# Patient Record
Sex: Female | Born: 1950 | Race: White | Hispanic: No | Smoking: Former smoker
Health system: Southern US, Community
[De-identification: ages and names within clinical notes are randomized; demographics above are authoritative.]

## PROBLEM LIST (undated history)

## (undated) DIAGNOSIS — R9082 White matter disease, unspecified: Principal | ICD-10-CM

## (undated) DIAGNOSIS — F419 Anxiety disorder, unspecified: Secondary | ICD-10-CM

## (undated) DIAGNOSIS — E7800 Pure hypercholesterolemia, unspecified: Secondary | ICD-10-CM

## (undated) HISTORY — PX: TOTAL VAGINAL HYSTERECTOMY: SHX2548

## (undated) HISTORY — DX: White matter disease, unspecified: R90.82

## (undated) HISTORY — PX: TONSILLECTOMY: SUR1361

## (undated) HISTORY — DX: Pure hypercholesterolemia, unspecified: E78.00

## (undated) HISTORY — DX: Anxiety disorder, unspecified: F41.9

---

## 2002-05-17 ENCOUNTER — Encounter: Payer: Self-pay | Admitting: Internal Medicine

## 2002-05-17 ENCOUNTER — Encounter: Admission: RE | Admit: 2002-05-17 | Discharge: 2002-05-17 | Payer: Self-pay | Admitting: Internal Medicine

## 2003-05-31 ENCOUNTER — Encounter: Payer: Self-pay | Admitting: Internal Medicine

## 2003-05-31 ENCOUNTER — Encounter: Admission: RE | Admit: 2003-05-31 | Discharge: 2003-05-31 | Payer: Self-pay | Admitting: Internal Medicine

## 2004-06-02 ENCOUNTER — Encounter: Admission: RE | Admit: 2004-06-02 | Discharge: 2004-06-02 | Payer: Self-pay | Admitting: Internal Medicine

## 2005-06-03 ENCOUNTER — Encounter: Admission: RE | Admit: 2005-06-03 | Discharge: 2005-06-03 | Payer: Self-pay | Admitting: Internal Medicine

## 2005-08-24 ENCOUNTER — Encounter: Admission: RE | Admit: 2005-08-24 | Discharge: 2005-08-24 | Payer: Self-pay | Admitting: Internal Medicine

## 2005-08-25 ENCOUNTER — Encounter: Admission: RE | Admit: 2005-08-25 | Discharge: 2005-08-25 | Payer: Self-pay | Admitting: Internal Medicine

## 2005-09-06 ENCOUNTER — Encounter: Admission: RE | Admit: 2005-09-06 | Discharge: 2005-09-06 | Payer: Self-pay | Admitting: Internal Medicine

## 2006-01-24 ENCOUNTER — Encounter: Admission: RE | Admit: 2006-01-24 | Discharge: 2006-01-24 | Payer: Self-pay | Admitting: Gastroenterology

## 2006-06-01 IMAGING — US US ABDOMEN COMPLETE
1 series · 14 of 25 positions shown · non-contrast
Comparison: CT dated 09-06-05.

CLINICAL DATA: Left upper quadrant abdominal pain. 
 ABDOMINAL SONOGRAM COMPLETE:
TECHNIQUE: Complete abdominal ultrasound examination was performed including evaluation of the liver, gallbladder, bile ducts, pancreas, kidneys, spleen, IVC, and abdominal aorta.

[Series 1: unknown · 14 of 60 slices shown]
[im 1/60]
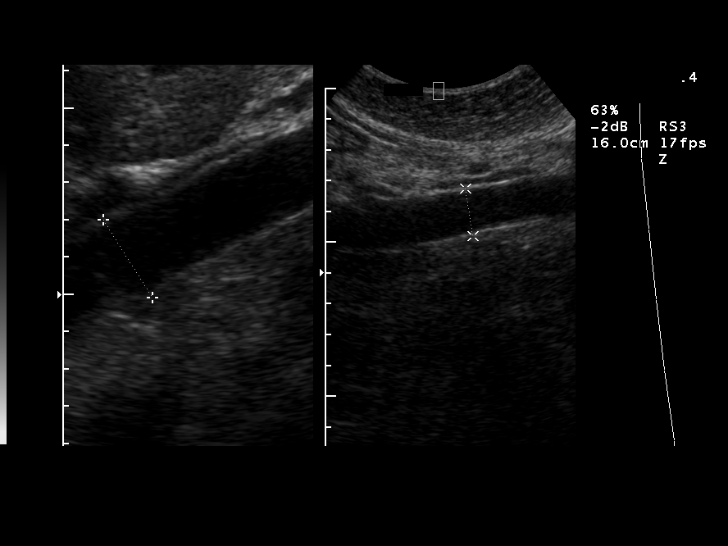
[im 5/60]
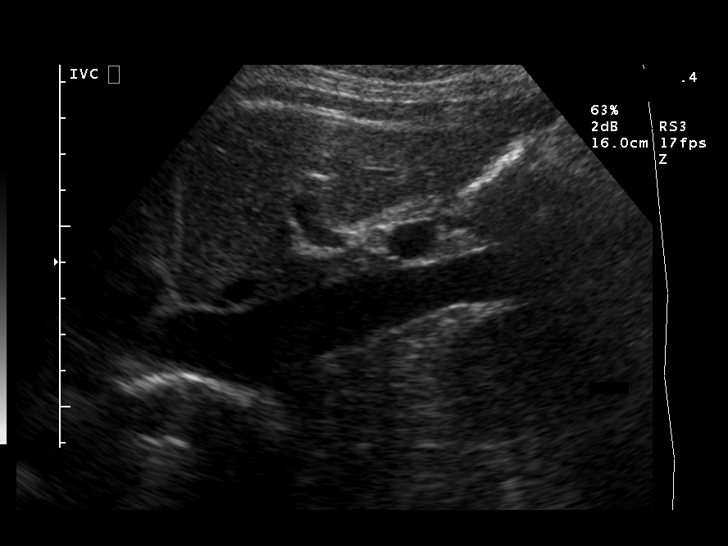
[im 10/60]
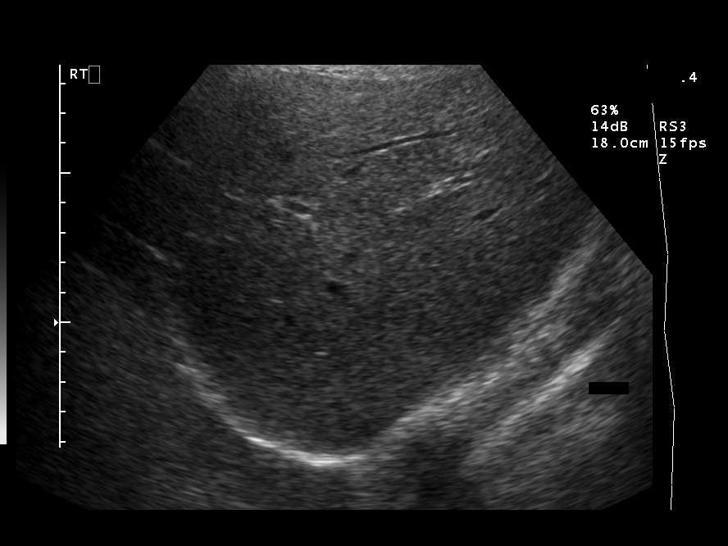
[im 15/60]
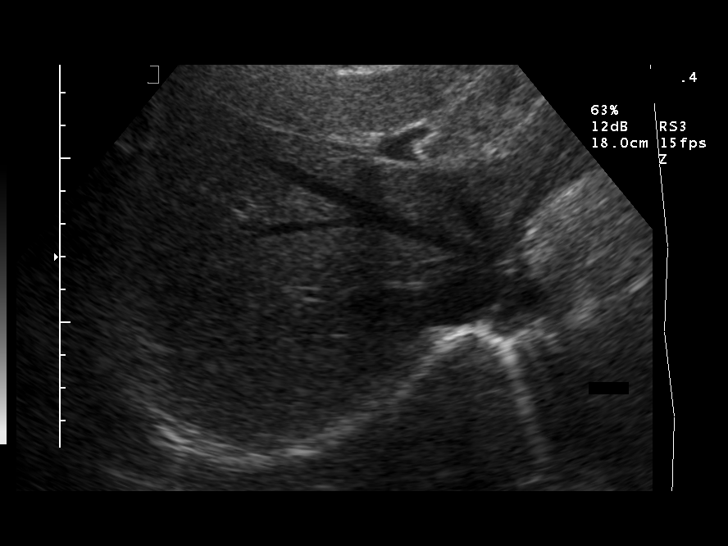
[im 20/60]
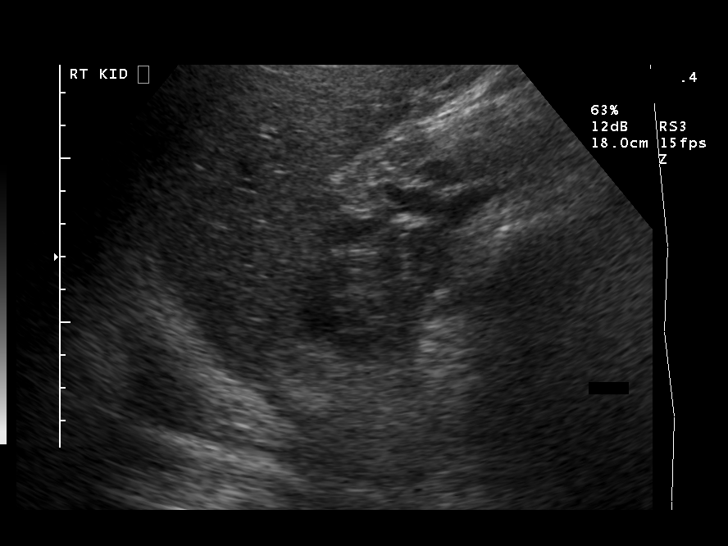
[im 23/60]
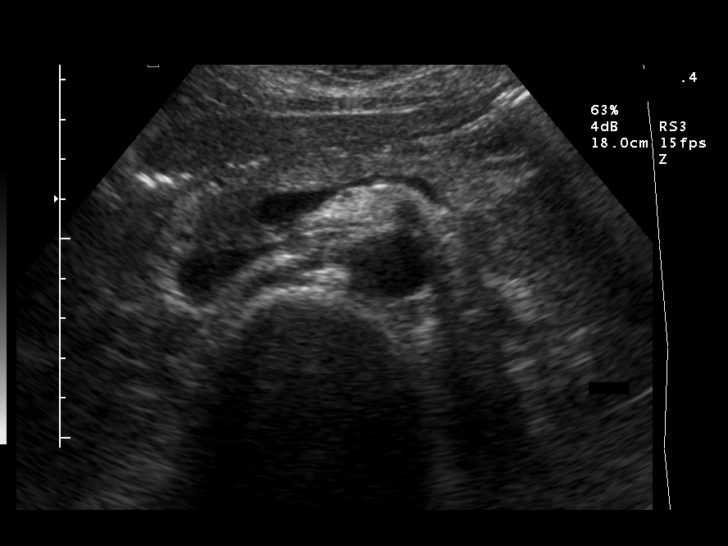
[im 28/60]
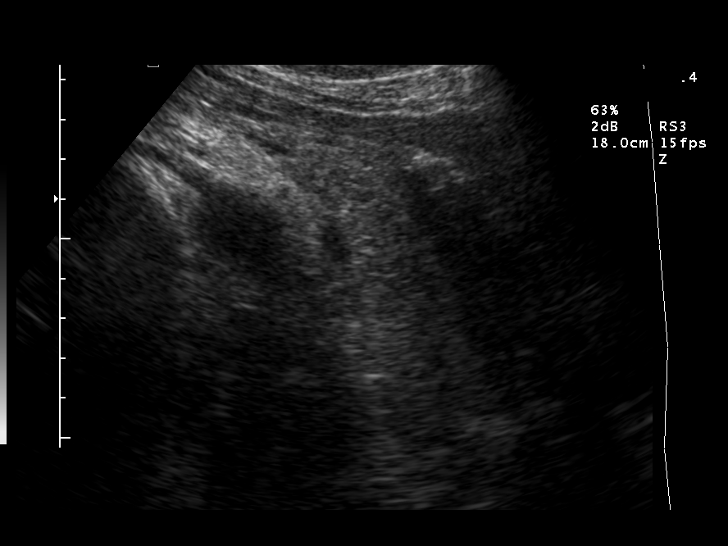
[im 32/60]
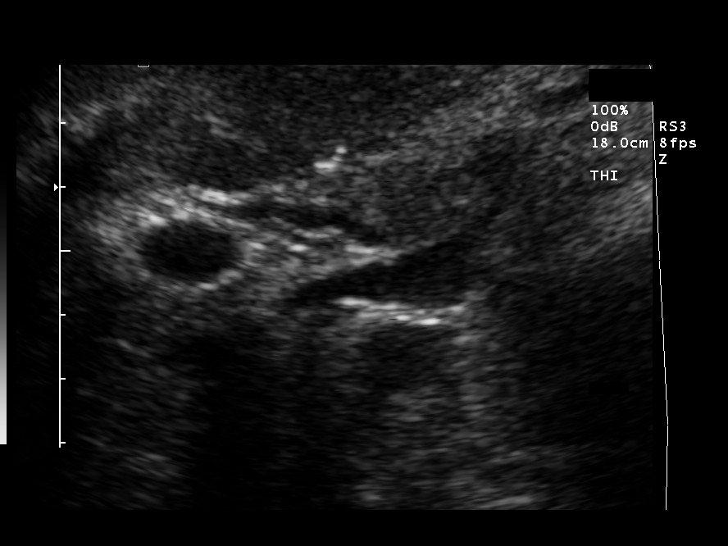
[im 37/60]
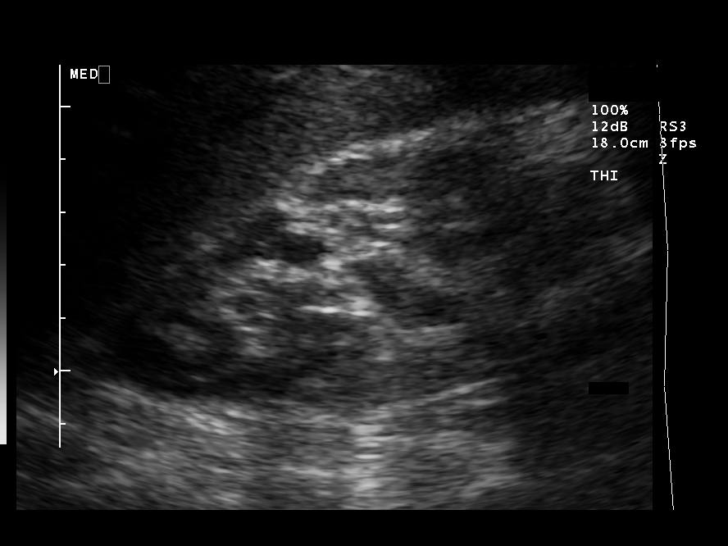
[im 40/60]
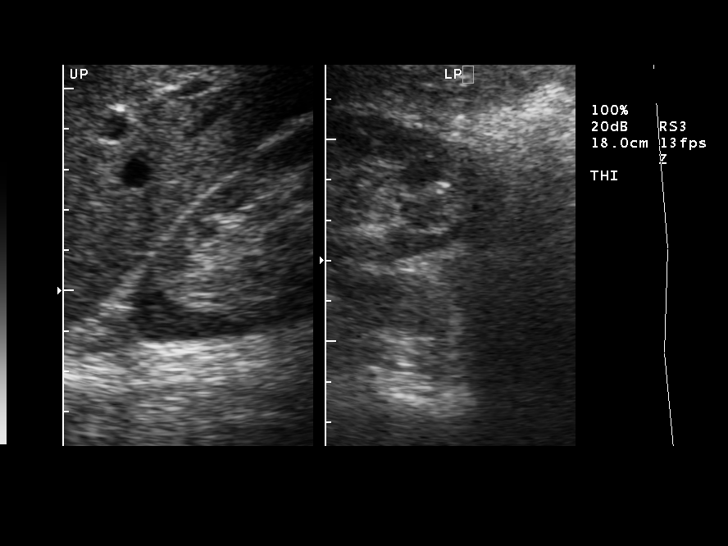
[im 45/60]
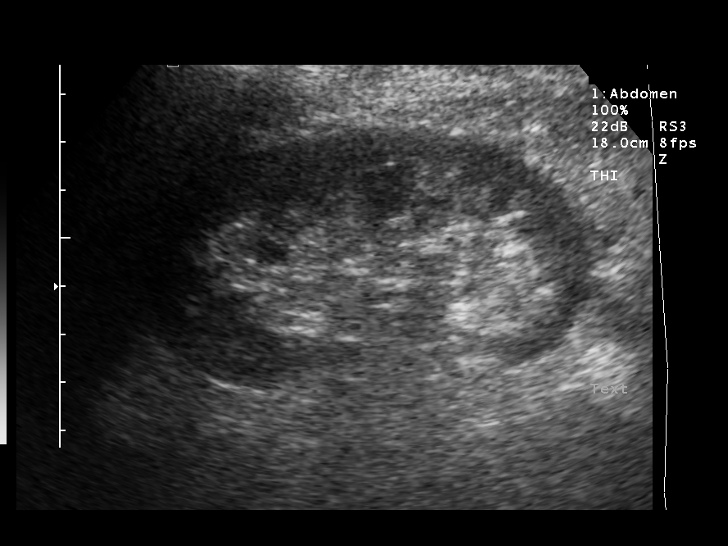
[im 50/60]
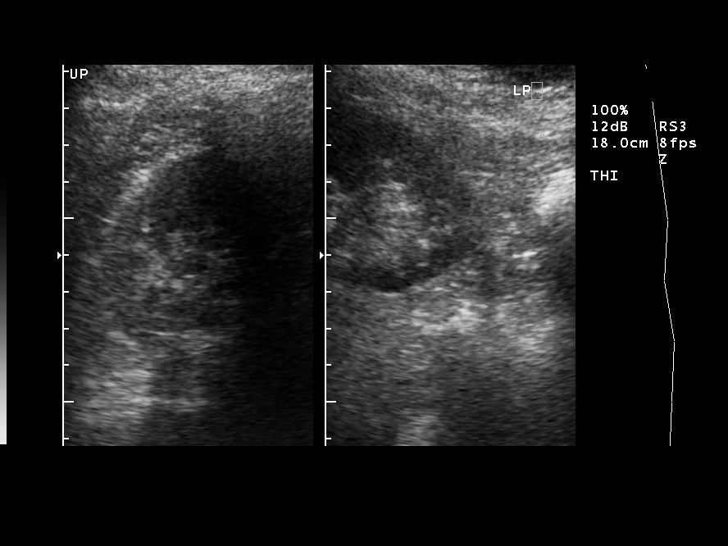
[im 55/60]
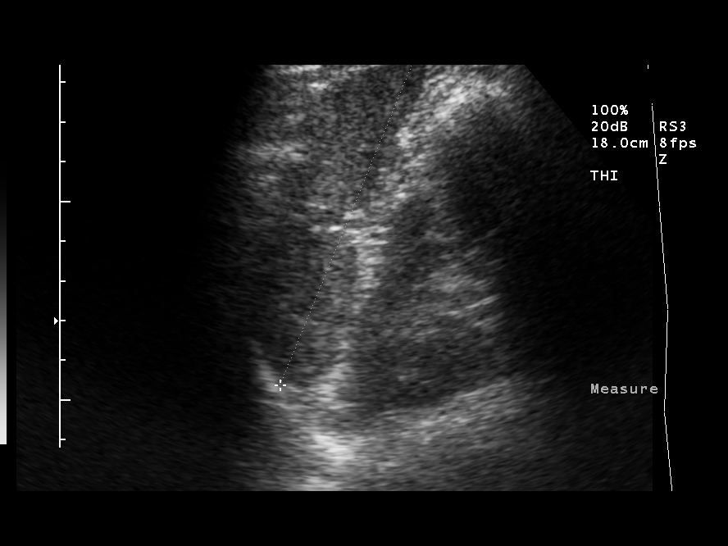
[im 60/60]
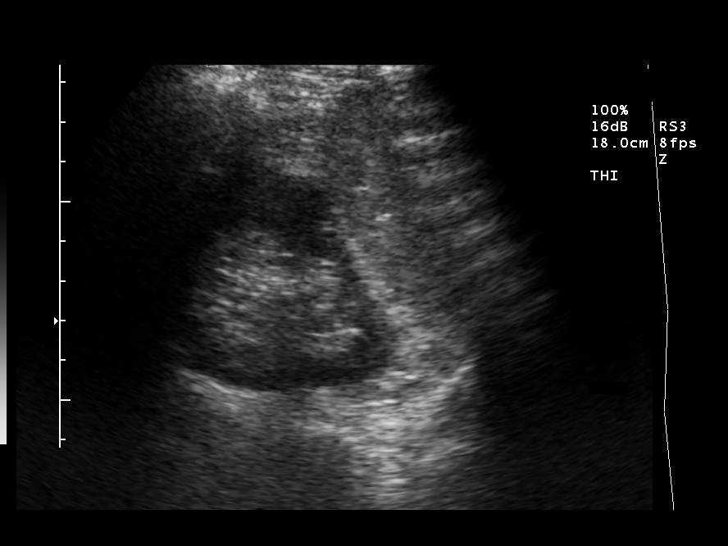

[14 of 25 positions shown; findings below may reference images not displayed]

FINDINGS: The patient is status post cholecystectomy.  Common bile duct is normal measuring 3.7 mm.  Liver negative.
 IVC normal. 
 Pancreas negative.  
 Spleen negative.  
 Right kidney is normal.
 Left kidney is normal.
 Abdominal aorta is normal in AP diameter measuring 2.5 cm.
IMPRESSION: 1.  Status post cholecystectomy.
 2.  No acute findings.

## 2006-06-21 ENCOUNTER — Encounter: Admission: RE | Admit: 2006-06-21 | Discharge: 2006-06-21 | Payer: Self-pay | Admitting: Internal Medicine

## 2007-06-29 ENCOUNTER — Encounter: Admission: RE | Admit: 2007-06-29 | Discharge: 2007-06-29 | Payer: Self-pay | Admitting: Endocrinology

## 2008-07-12 ENCOUNTER — Encounter: Admission: RE | Admit: 2008-07-12 | Discharge: 2008-07-12 | Payer: Self-pay | Admitting: Family Medicine

## 2009-07-14 ENCOUNTER — Encounter: Admission: RE | Admit: 2009-07-14 | Discharge: 2009-07-14 | Payer: Self-pay | Admitting: Family Medicine

## 2010-07-15 ENCOUNTER — Encounter: Admission: RE | Admit: 2010-07-15 | Discharge: 2010-07-15 | Payer: Self-pay | Admitting: *Deleted

## 2010-07-20 ENCOUNTER — Encounter: Admission: RE | Admit: 2010-07-20 | Discharge: 2010-07-20 | Payer: Self-pay | Admitting: *Deleted

## 2011-07-20 ENCOUNTER — Other Ambulatory Visit: Payer: Self-pay | Admitting: Family Medicine

## 2011-07-20 DIAGNOSIS — Z1231 Encounter for screening mammogram for malignant neoplasm of breast: Secondary | ICD-10-CM

## 2011-07-27 ENCOUNTER — Ambulatory Visit: Payer: Self-pay

## 2011-07-28 ENCOUNTER — Ambulatory Visit
Admission: RE | Admit: 2011-07-28 | Discharge: 2011-07-28 | Disposition: A | Discharge status: Home / Self Care | Payer: BC Managed Care – PPO | Source: Ambulatory Visit | Attending: Family Medicine | Admitting: Family Medicine

## 2011-07-28 DIAGNOSIS — Z1231 Encounter for screening mammogram for malignant neoplasm of breast: Secondary | ICD-10-CM

## 2013-02-14 ENCOUNTER — Other Ambulatory Visit: Payer: Self-pay | Admitting: Family Medicine

## 2013-02-14 DIAGNOSIS — M8580 Other specified disorders of bone density and structure, unspecified site: Secondary | ICD-10-CM

## 2013-02-14 DIAGNOSIS — Z1231 Encounter for screening mammogram for malignant neoplasm of breast: Secondary | ICD-10-CM

## 2013-04-03 ENCOUNTER — Ambulatory Visit
Admission: RE | Admit: 2013-04-03 | Discharge: 2013-04-03 | Disposition: A | Discharge status: Home / Self Care | Payer: BC Managed Care – PPO | Source: Ambulatory Visit | Attending: Family Medicine | Admitting: Family Medicine

## 2013-04-03 DIAGNOSIS — Z1231 Encounter for screening mammogram for malignant neoplasm of breast: Secondary | ICD-10-CM

## 2013-04-03 DIAGNOSIS — M8580 Other specified disorders of bone density and structure, unspecified site: Secondary | ICD-10-CM

## 2013-07-13 ENCOUNTER — Ambulatory Visit (INDEPENDENT_AMBULATORY_CARE_PROVIDER_SITE_OTHER): Payer: BC Managed Care – PPO | Admitting: Internal Medicine

## 2013-07-13 DIAGNOSIS — Z23 Encounter for immunization: Secondary | ICD-10-CM

## 2013-07-13 DIAGNOSIS — Z789 Other specified health status: Secondary | ICD-10-CM | POA: Insufficient documentation

## 2013-07-13 MED ORDER — ACETAZOLAMIDE 125 MG PO TABS: 125.0000 mg | ORAL_TABLET | Freq: Three times a day (TID) | ORAL | Status: DC

## 2013-07-13 MED ORDER — ATOVAQUONE-PROGUANIL HCL 250-100 MG PO TABS: 1.0000 | ORAL_TABLET | Freq: Every day | ORAL | Status: DC

## 2013-07-13 MED ORDER — CIPROFLOXACIN HCL 500 MG PO TABS: 500.0000 mg | ORAL_TABLET | Freq: Two times a day (BID) | ORAL | Status: DC

## 2013-07-13 MED ORDER — TYPHOID VACCINE PO CPDR: 1.0000 | DELAYED_RELEASE_CAPSULE | ORAL | Status: DC

## 2013-07-13 NOTE — Progress Notes (Signed)
  Subjective:    Caitlyn Yates is a 62 y.o. female who presents to the Infectious Disease clinic for travel consultation. Planned departure date: September 8          Planned return date: 8 days Countries of travel: [place] Areas in country: [place] to [place]   Accommodations: hotel Purpose of travel: vacation Prior travel out of [place]: yes Currently ill / Fever: no History of liver or kidney disease: no  Data Review:  medical history reviewed   Review of Systems n/a    Objective:    n/a    Assessment:    No contraindications to travel. none      Plan:    Issues discussed: altitude illness, environmental concerns, future shots, insect-borne illnesses, malaria, motion sickness, MVA safety, rabies, safe food/water, traveler's diarrhea, website/handouts for more information, what to do if ill upon return, what to do if ill while there and Yellow Fever. Immunizations recommended: Typhoid (oral) and Yellow Fever.discussed risks and benefits of vaccine Malaria prophylaxis: malarone, daily dose starting 1-2 days before entering endemic area, ending 7 days after leaving area Traveler's diarrhea prophylaxis: ciprofloxacin.

## 2013-07-20 ENCOUNTER — Other Ambulatory Visit: Payer: Self-pay | Admitting: *Deleted

## 2013-07-20 DIAGNOSIS — Z23 Encounter for immunization: Secondary | ICD-10-CM

## 2013-07-20 MED ORDER — TYPHOID VACCINE PO CPDR: 1.0000 | DELAYED_RELEASE_CAPSULE | ORAL | Status: DC

## 2013-08-20 ENCOUNTER — Telehealth: Payer: Self-pay | Admitting: *Deleted

## 2013-08-20 NOTE — Telephone Encounter (Signed)
Doubt malarone.  Could be something infectious.  If she doesn't get in to her primary, she should see one of [place] to at least do an evaluation and work up.

## 2013-08-20 NOTE — Telephone Encounter (Signed)
Patient called c/o not feeling well since returning from foreign travel. Dark stools and fatigue; wondered if it could have been caused from the Malarone she was prescribed. Told her I would send a note to Dr. Luciana Axe and advised her to call her PCP and arrange an appt. Wendall Mola

## 2013-08-21 NOTE — Telephone Encounter (Signed)
Patient did see PCP, lab work done. She said she is feeling somewhat better; but if she continues to have issues she will call the clinic for travel follow up appt. Wendall Mola

## 2013-10-09 ENCOUNTER — Encounter (INDEPENDENT_AMBULATORY_CARE_PROVIDER_SITE_OTHER): Payer: Self-pay

## 2013-10-09 ENCOUNTER — Encounter: Payer: Self-pay | Admitting: Diagnostic Neuroimaging

## 2013-10-09 ENCOUNTER — Ambulatory Visit (INDEPENDENT_AMBULATORY_CARE_PROVIDER_SITE_OTHER): Payer: BC Managed Care – PPO | Admitting: Diagnostic Neuroimaging

## 2013-10-09 VITALS — BP 101/69 | HR 56 | Temp 97.8°F | Ht 63.0 in | Wt 120.0 lb

## 2013-10-09 DIAGNOSIS — R413 Other amnesia: Secondary | ICD-10-CM

## 2013-10-09 DIAGNOSIS — G3184 Mild cognitive impairment, so stated: Secondary | ICD-10-CM

## 2013-10-09 NOTE — Patient Instructions (Signed)
Exercise, fruits and vegetables are very important to include in your daily routine.  I will setup MRI and neuropsych testing.

## 2013-10-09 NOTE — Progress Notes (Addendum)
[facility]  PATIENT: Caitlyn Yates DOB: Jan 28, 1951  REFERRING CLINICIAN: Fulp HISTORY FROM: patient REASON FOR VISIT: new consult   HISTORICAL  CHIEF COMPLAINT:  Chief Complaint  Patient presents with  . Memory Loss    HISTORY OF PRESENT ILLNESS:   62 year old right-handed female with hyperkalemia, anxiety, here for evaluation of memory loss.  Patient tells me that she thinks she has had a lifelong history of attention deficit disorder. She recalls times throughout her life when someone would talk to her and rather than listen to the other person, patient would think of one topic, jumped to the next topic, jump to the next topic and so on and then other person would question whether patient was listening to them or not. This is happened throughout her life as well as more recently. Her daughter has noted this particularly.  Around 2013, patient has noted increasing short-term memory problems. She also has some difficulty with driving directions and getting around. Other symptoms include feeling like a "foggy brain" sensation, difficulty with navigation, difficulty with recall of information, and telling the same story over and over again. No significant depression. She has had some mild anxiety tendencies. She has had some mild insomnia problems.   REVIEW OF SYSTEMS: Full 14 system review of systems performed and notable only for insomnia sleepiness memory loss confusion feeling cold fatigue.  ALLERGIES: No Known Allergies  HOME MEDICATIONS: Outpatient Prescriptions Prior to Visit  Medication Sig Dispense Refill  . acetaZOLAMIDE (DIAMOX) 125 MG tablet Take 1 tablet (125 mg total) by mouth 3 (three) times daily.  16 tablet  0  . atovaquone-proguanil (MALARONE) 250-100 MG TABS Take 1 tablet by mouth daily.  16 tablet  0  . ciprofloxacin (CIPRO) 500 MG tablet Take 1 tablet (500 mg total) by mouth 2 (two) times daily.  10 tablet  0  . typhoid (VIVOTIF) DR  capsule Take 1 capsule by mouth every other day.  4 capsule  0   No facility-administered medications prior to visit.    PAST MEDICAL HISTORY: Past Medical History  Diagnosis Date  . High cholesterol   . Anxiety     PAST SURGICAL HISTORY: Past Surgical History  Procedure Laterality Date  . Tonsillectomy    . Total vaginal hysterectomy      FAMILY HISTORY: Family History  Problem Relation Age of Onset  . Ovarian cancer Mother   . Cancer Father   . Memory loss Father     SOCIAL HISTORY:  History   Social History  . Marital Status: Widowed    Spouse Name: N/A    Number of Children: 2  . Years of Education: MA   Occupational History  . Retired    Social History Main Topics  . Smoking status: Former Smoker -- 2.00 packs/day for 20 years    Types: Cigarettes    Quit date: 11/29/1986  . Smokeless tobacco: Never Used  . Alcohol Use: No  . Drug Use: No  . Sexual Activity: Not on file   Other Topics Concern  . Not on file   Social History Narrative   Patient live at home alone.   Caffeine Use: 16oz-20oz daily     PHYSICAL EXAM  Filed Vitals:   10/09/13 0857  BP: 101/69  Pulse: 56  Temp: 97.8 F (36.6 C)  TempSrc: Oral  Height: 5\' 3"  (1.6 m)  Weight: 120 lb (54.432 kg)    Not recorded    Body mass index is 21.26  kg/(m^2).  GENERAL EXAM: Patient is in no distress  CARDIOVASCULAR: Regular rate and rhythm, no murmurs, no carotid bruits  NEUROLOGIC: MENTAL STATUS: awake, alert, language fluent, comprehension intact, naming intact; MMSE 27/30 (misses date, day, last 1 on serial 7). MOCA 21/30 (0/5 recall). Letter fluency test = 21. GDS 2. BORDERLINE MYERSON'S. NEG SNOUT, PALMOMENTAL AND ROOTING REFLEXES. CRANIAL NERVE: no papilledema on fundoscopic exam, pupils equal and reactive to light, visual fields full to confrontation, extraocular muscles intact, no nystagmus, facial sensation and strength symmetric, uvula midline, shoulder shrug symmetric,  tongue midline. MOTOR: normal bulk and tone, full strength in the BUE, BLE SENSORY: normal and symmetric to light touch, temperature, vibration COORDINATION: finger-nose-finger, fine finger movements normal; MINIMAL POSTURAL AND ACTION TREMOR. REFLEXES: deep tendon reflexes present; DECR IN LEFT LEG (1+). GAIT/STATION: narrow based gait; able to walk tandem; romberg is negative   DIAGNOSTIC DATA (LABS, IMAGING, TESTING) - I reviewed patient records, labs, notes, testing and imaging myself where available.  No results found for this basename: WBC, HGB, HCT, MCV, PLT   No results found for this basename: na, k, cl, co2, glucose, bun, creatinine, calcium, prot, albumin, ast, alt, alkphos, bilitot, gfrnonaa, gfraa   No results found for this basename: CHOL, HDL, LDLCALC, LDLDIRECT, TRIG, CHOLHDL   No results found for this basename: HGBA1C   No results found for this basename: VITAMINB12   No results found for this basename: TSH    B12 1060  Vit D 73  T4 7.9   ASSESSMENT AND PLAN  62 y.o. year old female here with 1 year history of memory issues, poor attention, brain fog.  MMSE 27/30 (misses date, day, last 1 on serial 7). MOCA 21/30 (0/5 recall). Letter fluency test = 21. GDS 2. BORDERLINE MYERSON'S. NEG SNOUT, PALMOMENTAL AND ROOTING REFLEXES.  Ddx: MCI, mild anxiety state, ADD, autoimmune/inflamm, vascular  PLAN: - add'l testing - exercise, healthy diet and sleep reviewed  Orders Placed This Encounter  Procedures  . MR Brain Wo Contrast  . Ambulatory referral to Neuropsychology    Return in about 3 months (around 01/09/2014) for with Heide Guile or Antonis Lor.    Suanne Marker, MD 10/09/2013, 9:51 AM Certified in Neurology, Neurophysiology and Neuroimaging  [facility] [address], [address] [place], [place] [address] [phone]

## 2013-10-12 ENCOUNTER — Telehealth: Payer: Self-pay | Admitting: *Deleted

## 2013-10-12 NOTE — Telephone Encounter (Signed)
I called pt back and relayed that after speaking to both providers in question, change was made to Dr. Anne Hahn.  Pt will have MRI after authorized and then [facility] or facility will call to schedule.  This can take up to 10 days.  Pt then will be called with results (either Dr. Anne Hahn or staff).  F/u appt confirmed with pt for 01-11-13 with LLam, NP and Dr. Anne Hahn as provider (who is on site when seen by NP).  She verbalized understanding.

## 2013-10-30 ENCOUNTER — Ambulatory Visit: Payer: BC Managed Care – PPO | Admitting: Licensed Clinical Social Worker

## 2013-10-31 ENCOUNTER — Ambulatory Visit (INDEPENDENT_AMBULATORY_CARE_PROVIDER_SITE_OTHER): Payer: BC Managed Care – PPO

## 2013-10-31 ENCOUNTER — Encounter: Payer: Self-pay | Admitting: Nurse Practitioner

## 2013-10-31 DIAGNOSIS — G3184 Mild cognitive impairment, so stated: Secondary | ICD-10-CM

## 2013-10-31 DIAGNOSIS — R413 Other amnesia: Secondary | ICD-10-CM

## 2013-11-02 ENCOUNTER — Telehealth: Payer: Self-pay | Admitting: Neurology

## 2013-11-08 ENCOUNTER — Telehealth: Payer: Self-pay | Admitting: Nurse Practitioner

## 2013-11-09 ENCOUNTER — Telehealth: Payer: Self-pay | Admitting: Neurology

## 2013-11-09 DIAGNOSIS — R9089 Other abnormal findings on diagnostic imaging of central nervous system: Secondary | ICD-10-CM

## 2013-11-09 DIAGNOSIS — R413 Other amnesia: Secondary | ICD-10-CM

## 2013-11-09 NOTE — Telephone Encounter (Signed)
Please advise 

## 2013-11-09 NOTE — Telephone Encounter (Signed)
Caitlyn Yates, patient has requested transition to you a couple weeks ago. -VRP

## 2013-11-09 NOTE — Telephone Encounter (Signed)
Caitlyn Yates from [facility], Dr Fulp's office called stating that patient is very emotional and upset on the phone and would like an earlier follow up appointment than the one scheduled in February. Dr Jillyn Hidden is wondering if we can see her sooner. Please call to schedule.

## 2013-11-09 NOTE — Telephone Encounter (Signed)
I called patient. The patient had the MRI the brain done on 11/01/2013. This shows multiple ovoid white matter lesions, some perpendicular to the corpus callosum, sounds as if it may be a pattern consistent with demyelinating disease. The patient is followed by Dr. Danae Orleans, who is not in the office today. It is likely that he will want to get further blood work, and probable lumbar puncture. I will go ahead and order MRI of the cervical spine, as if lesions are seen in the spinal cord, this could also help with the diagnosis. The patient does not have significant risk factors for cerebrovascular disease. Dr. Danae Orleans will need to call the patient when he gets back.

## 2013-11-23 ENCOUNTER — Telehealth: Payer: Self-pay | Admitting: *Deleted

## 2013-11-26 ENCOUNTER — Ambulatory Visit
Admission: RE | Admit: 2013-11-26 | Discharge: 2013-11-26 | Disposition: A | Discharge status: Home / Self Care | Payer: BC Managed Care – PPO | Source: Ambulatory Visit | Attending: Neurology | Admitting: Neurology

## 2013-11-26 DIAGNOSIS — R930 Abnormal findings on diagnostic imaging of skull and head, not elsewhere classified: Secondary | ICD-10-CM

## 2013-11-26 DIAGNOSIS — R413 Other amnesia: Secondary | ICD-10-CM

## 2013-11-26 DIAGNOSIS — R9089 Other abnormal findings on diagnostic imaging of central nervous system: Secondary | ICD-10-CM

## 2013-11-26 MED ORDER — GADOBENATE DIMEGLUMINE 529 MG/ML IV SOLN
11.0000 mL | Freq: Once | INTRAVENOUS | Status: AC | PRN
  Administered 2013-11-26: 11 mL via INTRAVENOUS

## 2013-11-26 NOTE — Telephone Encounter (Signed)
Called patient in regards to the message on 11/23/13, left message to return call

## 2013-11-26 NOTE — Telephone Encounter (Signed)
Patient returned call, questioned why 2nd MRI was ordered and explained to patient, patient was informed that the office will be closed 31st-2nd, if she had any other questions or concerns

## 2013-12-02 ENCOUNTER — Telehealth: Payer: Self-pay | Admitting: Neurology

## 2013-12-02 DIAGNOSIS — R9089 Other abnormal findings on diagnostic imaging of central nervous system: Secondary | ICD-10-CM

## 2013-12-02 NOTE — Telephone Encounter (Signed)
I called the patient. The MRI of the cervical spine shows a disc bulge at the C5-6 level with moderate facet joint arthritis at this level. No cord or NR impingement. The MRI of the brain is abnormal, could be MS. I will get an LP and further blood work done. She claims that Dr. Alessandra BevelsVaughn has done a connective tissue disease work up.

## 2013-12-13 ENCOUNTER — Ambulatory Visit (INDEPENDENT_AMBULATORY_CARE_PROVIDER_SITE_OTHER): Payer: BC Managed Care – PPO | Admitting: Neurology

## 2013-12-13 ENCOUNTER — Encounter: Payer: Self-pay | Admitting: Neurology

## 2013-12-13 VITALS — BP 110/62 | HR 58 | Wt 127.0 lb

## 2013-12-13 DIAGNOSIS — R5381 Other malaise: Secondary | ICD-10-CM

## 2013-12-13 DIAGNOSIS — R9089 Other abnormal findings on diagnostic imaging of central nervous system: Secondary | ICD-10-CM

## 2013-12-13 DIAGNOSIS — R9082 White matter disease, unspecified: Secondary | ICD-10-CM

## 2013-12-13 DIAGNOSIS — R930 Abnormal findings on diagnostic imaging of skull and head, not elsewhere classified: Secondary | ICD-10-CM

## 2013-12-13 DIAGNOSIS — R5383 Other fatigue: Secondary | ICD-10-CM

## 2013-12-13 HISTORY — DX: White matter disease, unspecified: R90.82

## 2013-12-13 NOTE — Procedures (Signed)
    Lumbar puncture procedure note  History:   Caitlyn Yates is a 63 year old patient with a several year history of problems with concentration and memory, worse over the last 2 months. The patient also reports a significant increase in fatigue over the last 2 months. 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 The patient is being evaluated for this issue.  The patient was placed in the fetal position on the right side, and the low back was cleaned with Betadine solution. Approximately 2 cc of 1% Xylocaine was used as a local anesthetic. A 20-gauge spinal needle was inserted into the L3-4 interspace, and approximately 18 cc of clear colorless spinal fluid was removed for testing. Opening pressure was 130 mm of water.  Tube #1 was sent for VDRL, cryptococcal antigen, angiotensin-converting enzyme level.  Tube #2 was sent for oligoclonal banding, IgG albumin ratio.  Tube #3 was sent for cells, differential, glucose, and protein.  Tube #4 was sent for Lyme antibody panel.  The patient tolerated the procedure well. No complications of the above procedure were noted.  Blood work for ANA, rheumatoid factor, lupus anticoagulant antibody, factor V Leiden, B12 level was sent.

## 2013-12-13 NOTE — Progress Notes (Signed)
Caitlyn Yates is a 63 year old patient with a several year history of difficulty with concentration and memory. This may have worsened over the last several months prior to this evaluation. The patient also reports a significant worsening of fatigue that has occurred over the last 2 months. MRI of the brain has shown evidence of white matter changes that are consistent with demyelinating disease. MRI of the cervical spine was unremarkable.  The patient comes in today for lumbar puncture.  Physical Exam  General: The patient is alert and cooperative at the time of the examination.  Skin: No significant peripheral edema is noted.   Neurologic Exam  Mental status: The patient is oriented x 3.  Cranial nerves: Facial symmetry is present. Speech is normal, no aphasia or dysarthria is noted. Extraocular movements are full. Visual fields are full.  Motor: The patient has good strength in all 4 extremities.  Sensory examination: Soft touch sensation on the face, arms, and legs is symmetric.  Coordination: The patient has good finger-nose-finger and heel-to-shin bilaterally.  Gait and station: The patient has a normal gait. Tandem gait is normal. Romberg is negative. No drift is seen.  Reflexes: Deep tendon reflexes are symmetric.   Lumbar puncture was done today, blood work will be done today. The patient will followup in 3 months.

## 2013-12-14 LAB — IGG CSF INDEX
ALBUMIN: 4.3 g/dL (ref 3.6–4.8)
Albumin CSF-mCnc: 19 mg/dL (ref 11–48)
IGG INDEX CSF: 0.6 (ref 0.0–0.7)
IgG (Immunoglobin G), Serum: 937 mg/dL (ref 700–1600)
IgG, CSF: 2.3 mg/dL (ref 0.0–8.6)
IgG/Albumin Ratio, CSF: 0.12 (ref 0.00–0.25)

## 2013-12-14 LAB — LYME, WESTERN BLOT, CSF
LYME IGG WB INTERP.: NEGATIVE
Lyme IgM WB Interp.: NEGATIVE
P18 Ab.: ABSENT
P23 AB.: ABSENT
P23 Ab. IgM: ABSENT
P28 Ab.: ABSENT
P30 Ab.: ABSENT
P39 Ab. IgM: ABSENT
P39 Ab.: ABSENT
P41 AB. IGM: ABSENT
P41 Ab.: ABSENT
P45 Ab.: ABSENT
P58 Ab.: ABSENT
P66 AB.: ABSENT
P93 AB.: ABSENT

## 2013-12-14 LAB — CELL COUNT, CSF
NUC CELL # CSF: 0 {cells}/uL (ref 0–5)
RBC, CSF: 0 /uL

## 2013-12-14 LAB — PROTEIN, TOTAL, CSF: Protein, CSF: 32.5 mg/dL (ref 0.0–44.0)

## 2013-12-14 LAB — OLIGOCLONAL BANDING

## 2013-12-14 LAB — GLUCOSE, CEREBROSPINAL FLUID: Glucose, CSF: 57 mg/dL (ref 40–70)

## 2013-12-18 ENCOUNTER — Telehealth: Payer: Self-pay | Admitting: Neurology

## 2013-12-18 LAB — CRYPTO AG, CSF, REBASELINE
CRYPTOCOCCUS ANTIGEN, CSF: NEGATIVE
CRYPTOCOCCUS ANTIGEN, CSF: NEGATIVE

## 2013-12-18 LAB — ANGIOTENSIN CONVERTING ENZYME, CSF: Angio Convert Enzyme, CSF: 2.1 U/L (ref 0.0–2.5)

## 2013-12-18 LAB — VDRL, CSF: VDRL Quant, CSF: NONREACTIVE

## 2013-12-18 NOTE — Telephone Encounter (Signed)
I called the patient. The patient has undergone lumbar puncture and blood work. These studies are unremarkable with exception of a positive ANA in low titer. the oligoclonal banding is negative. MS cannot be confirmed on this study. I would repeat the MRI of the brain in 6 months, and if changes are noted, I would initiate treatment for presumed multiple sclerosis. The prior MRI was done without contrast only.

## 2013-12-20 ENCOUNTER — Telehealth: Payer: Self-pay | Admitting: Neurology

## 2013-12-20 LAB — ENA+DNA/DS+SJORGEN'S
DSDNA AB: 67 [IU]/mL — AB (ref 0–9)
ENA RNP Ab: <0.2 AI (ref 0.0–0.9)
ENA SM Ab Ser-aCnc: <0.2 AI (ref 0.0–0.9)
ENA SSA (RO) Ab: <0.2 AI (ref 0.0–0.9)
ENA SSB (LA) Ab: <0.2 AI (ref 0.0–0.9)

## 2013-12-20 LAB — FACTOR 5 LEIDEN

## 2013-12-20 LAB — CARDIOLIPIN ANTIBODY: Anticardiolipin IgA: <9 APL U/mL (ref 0–11)

## 2013-12-20 LAB — LUPUS ANTICOAGULANT
Dilute Viper Venom Time: 28.5 s (ref 0.0–55.1)
PTT Lupus Anticoagulant: 31.4 s (ref 0.0–50.0)
THROMBIN TIME: 17.2 s (ref 0.0–20.0)
dPT Confirm Ratio: 1.01 Ratio (ref 0.00–1.20)
dPT: 34.3 s (ref 0.0–55.0)

## 2013-12-20 LAB — RHEUMATOID FACTOR: Rhuematoid fact SerPl-aCnc: 9.9 IU/mL (ref 0.0–13.9)

## 2013-12-20 LAB — ANGIOTENSIN CONVERTING ENZYME: ANGIO CONVERT ENZYME: 33 U/L (ref 14–82)

## 2013-12-20 LAB — VITAMIN B12: VITAMIN B 12: 1355 pg/mL — AB (ref 211–946)

## 2013-12-20 LAB — ANA W/REFLEX: Anti Nuclear Antibody(ANA): POSITIVE — AB

## 2013-12-20 NOTE — Telephone Encounter (Signed)
Dr. Elizabeth Vaughn leftAllena Napoleon message that she would like to speak with Dr. Anne HahnWillis regarding results of patient's LP and what he has to offer.  She has a number of different infections that she will be treating in the next week or so.  She can be reached on her cell phone at [phone]

## 2013-12-20 NOTE — Telephone Encounter (Signed)
I called Dr. Alessandra BevelsVaughn today, left a message on her cell phone, I gave her my cell phone number to call if she needs to contact me. I have sent the blood work and spinal fluid results to her.

## 2013-12-25 ENCOUNTER — Telehealth: Payer: Self-pay | Admitting: Neurology

## 2013-12-25 NOTE — Telephone Encounter (Signed)
I called patient. I went over the blood work and spinal fluid results with her again. The patient indicates that she has been having relatively frequent headache, but she does not want to go on medications currently. The headaches are associated with a lot of pressure. We may consider using nortriptyline the future if she does want to go on medications.

## 2013-12-25 NOTE — Telephone Encounter (Signed)
Spoke to patient and she had questions about what type of lab tests were done.  She asked about protozoa, cryptococcus, candida.  Also said her headache has been severe for awhile and will not be able to wait to see the doctor.  [phone]

## 2014-01-11 ENCOUNTER — Ambulatory Visit: Payer: BC Managed Care – PPO | Admitting: Nurse Practitioner

## 2014-02-19 ENCOUNTER — Ambulatory Visit (INDEPENDENT_AMBULATORY_CARE_PROVIDER_SITE_OTHER): Payer: BC Managed Care – PPO | Admitting: Internal Medicine

## 2014-02-19 ENCOUNTER — Encounter: Payer: Self-pay | Admitting: Internal Medicine

## 2014-02-19 VITALS — BP 128/77 | HR 74 | Temp 98.4°F | Wt 130.0 lb

## 2014-02-19 DIAGNOSIS — R5381 Other malaise: Secondary | ICD-10-CM

## 2014-02-19 DIAGNOSIS — R5383 Other fatigue: Principal | ICD-10-CM

## 2014-02-19 LAB — CBC WITH DIFFERENTIAL/PLATELET
BASOS ABS: 0 10*3/uL (ref 0.0–0.1)
Basophils Relative: 1 % (ref 0–1)
EOS ABS: 0.2 10*3/uL (ref 0.0–0.7)
EOS PCT: 5 % (ref 0–5)
HCT: 39.2 % (ref 36.0–46.0)
Hemoglobin: 13.1 g/dL (ref 12.0–15.0)
Lymphocytes Relative: 34 % (ref 12–46)
Lymphs Abs: 1.6 10*3/uL (ref 0.7–4.0)
MCH: 29.2 pg (ref 26.0–34.0)
MCHC: 33.4 g/dL (ref 30.0–36.0)
MCV: 87.5 fL (ref 78.0–100.0)
Monocytes Absolute: 0.5 10*3/uL (ref 0.1–1.0)
Monocytes Relative: 10 % (ref 3–12)
Neutro Abs: 2.4 10*3/uL (ref 1.7–7.7)
Neutrophils Relative %: 50 % (ref 43–77)
PLATELETS: 325 10*3/uL (ref 150–400)
RBC: 4.48 MIL/uL (ref 3.87–5.11)
RDW: 13.6 % (ref 11.5–15.5)
WBC: 4.7 10*3/uL (ref 4.0–10.5)

## 2014-02-19 LAB — COMPLETE METABOLIC PANEL WITH GFR
ALT: 15 U/L (ref 0–35)
AST: 24 U/L (ref 0–37)
Albumin: 4.1 g/dL (ref 3.5–5.2)
Alkaline Phosphatase: 49 U/L (ref 39–117)
BUN: 21 mg/dL (ref 6–23)
CALCIUM: 9.7 mg/dL (ref 8.4–10.5)
CHLORIDE: 102 meq/L (ref 96–112)
CO2: 27 mEq/L (ref 19–32)
Creat: 1.15 mg/dL — ABNORMAL HIGH (ref 0.50–1.10)
GFR, Est African American: 58 mL/min — ABNORMAL LOW
GFR, Est Non African American: 50 mL/min — ABNORMAL LOW
Glucose, Bld: 85 mg/dL (ref 70–99)
Potassium: 4.5 mEq/L (ref 3.5–5.3)
Sodium: 135 mEq/L (ref 135–145)
Total Bilirubin: 0.2 mg/dL (ref 0.2–1.2)
Total Protein: 6.5 g/dL (ref 6.0–8.3)

## 2014-02-19 NOTE — Progress Notes (Signed)
Subjective:    Patient ID: Caitlyn BaconNancy J Yates, female    DOB: 02/01/1951, 63 y.o.   MRN: [id number]  HPI Caitlyn Yates is a 63yo F with history of HLD and foreign travel. 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 Did a 2 week trip to [place] in Oct 2013 and upon return from her trip, she thought she started to have the beginning of short term memory loss. She also had a week long trip to [place] in Sep 2014, recalled eating undercook/rancid meat one time. Since December 2014, she has had significant fatigue, headache. She was referred to neurology with Dr. Anne HahnWillis where she underwent brian/spine MRI for concern of MS. CSF spinal fluid analysis non-revealing. The plan at that time was to return in 3 months for repeat brain imaging + repeat LP to see if still could be beginning stages of MS. In the meantime, she started to seek care with Dr. [facility], where her lab work up showed + ANA 1:80, speckled, otherwise WNL. 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 Since late December, she has been doing a "detox" regimen which includes taking combined minocycline, doxycycline, ceftriaxone, flagyl and fluconazole in rotating 3 wk schedule for the past 3 months. She states that she has not had much improvement with fatigue, and headache. She was referred her by her PCP, Dr. Jillyn HiddenFulp for further evaluation of "protozoa"  Current Outpatient Prescriptions on File Prior to Visit  Medication Sig Dispense Refill  . ALPRAZolam (XANAX) 0.5 MG tablet Take 1 tablet by mouth daily.      Marland Kitchen. BIOTIN PO Take by mouth. 8mg       . Cholecalciferol (VITAMIN D-3 PO) Take 1 tablet by mouth daily.      . Digestive Enzymes (BETAINE HCL PO) Take 1 tablet by mouth daily.      . Multiple Vitamins-Minerals (MEMORY VITE PO) Take 1 tablet by mouth daily.      . Nutritional Supplements (OSTEO ADVANCE  PO) Take 2 tablets by mouth daily.      . Omega-3 Fatty Acids (OMEGA 3 PO) Take by mouth.      . simvastatin (ZOCOR) 10 MG tablet Take 0.5 tablets by mouth daily.       No current facility-administered medications on file prior to visit.   Active Ambulatory Problems    Diagnosis Date Noted  . Travel foreign 07/13/2013  . White matter abnormality on MRI of brain 12/13/2013  . Other malaise and fatigue 12/13/2013   Resolved Ambulatory Problems    Diagnosis Date Noted  . No Resolved Ambulatory Problems   Past Medical History  Diagnosis Date  . High cholesterol   . Anxiety       Review of Systems  Constitutional:fatigue; Negative for fever, chills, diaphoresis, activity change, appetite change,and unexpected weight change.  HENT: Negative for congestion, sore throat, rhinorrhea, sneezing, trouble swallowing and sinus pressure.  Eyes: Negative for photophobia and visual disturbance.  Respiratory: Negative for cough, chest tightness, shortness of breath, wheezing and stridor.  Cardiovascular: Negative for chest pain, palpitations and leg swelling.  Gastrointestinal: Negative for nausea, vomiting, abdominal pain, diarrhea, constipation, blood in stool, abdominal distention and anal bleeding.  Genitourinary: Negative for dysuria, hematuria, flank pain and difficulty urinating.  Musculoskeletal: Negative for myalgias, back pain, joint swelling, arthralgias and gait problem.  Skin: Negative for color change, pallor,  rash and wound.  Neurological:headache Negative for dizziness, tremors, weakness and light-headedness.  Hematological: Negative for adenopathy. Does not bruise/bleed easily.  Psychiatric/Behavioral: Negative for behavioral problems, confusion, sleep disturbance, dysphoric mood, decreased concentration and agitation.       Objective:   Physical Exam BP 128/77  Pulse 74  Temp(Src) 98.4 F (36.9 C) (Oral)  Wt 130 lb (58.968 kg) Physical Exam  Constitutional:  oriented  to person, place, and time. appears well-developed and well-nourished. No distress.  HENT:  Mouth/Throat: Oropharynx is clear and moist. No oropharyngeal exudate.  Lymphadenopathy: no cervical adenopathy.  Neurological: alert and oriented to person, place, and time.  Skin: Skin is warm and dry. No rash noted. No erythema.  Psychiatric: a normal mood and affect.  behavior is normal.       Assessment & Plan:  Will check cbc with diff, cmp. Will check neurocystericercosis immunoblot from CDC since she has multiple lesions noted on brain mri. Will need to get review of mri to see if consistent with NCC.   I have asked her to stop taking her "detox" regimen since it is not standard practice of medicine to treat "protozoa" from non-fda lab testing. [facility] is highly suspect for misinformation based upon non-FDA approved labs. The detox regimen prescribed by Dr. Ananias Pilgrim is  outside of the current standard of practice of infectious diseases and could likely cause more harm, such as place her at risk for c.difficile infection and cause colonization with drug resistant bacteria such as ESBL or CRE.This is my first time encountering one of Dr. Revonda Humphrey patients from her holistic medicine clinic who is on a "detox" regimen which is highly unconventional from what an infectious diseases or internal medicine physician would prescribe given the patient's presentation.  Will see her back in 2 wk to discuss lab results from the CDC  Encouraged her to follow up with Dr. Anne Hahn. Even if she does have neurocysticercosis, this would not explain her fatigue and headache.  Duke Salvia Drue Second MD MPH [facility] [phone]

## 2014-03-05 ENCOUNTER — Encounter: Payer: Self-pay | Admitting: Internal Medicine

## 2014-03-05 ENCOUNTER — Ambulatory Visit (INDEPENDENT_AMBULATORY_CARE_PROVIDER_SITE_OTHER): Payer: BC Managed Care – PPO | Admitting: Internal Medicine

## 2014-03-05 VITALS — BP 116/72 | HR 65 | Temp 97.6°F | Wt 131.0 lb

## 2014-03-05 DIAGNOSIS — R51 Headache: Secondary | ICD-10-CM

## 2014-03-06 ENCOUNTER — Telehealth: Payer: Self-pay | Admitting: *Deleted

## 2014-03-06 NOTE — Telephone Encounter (Signed)
Wanted Dr. Drue SecondSnider to know that she has been approved by insurance to see Dr. Harlen LabsEmily Pharr.

## 2014-03-15 ENCOUNTER — Telehealth: Payer: Self-pay | Admitting: *Deleted

## 2014-03-15 NOTE — Telephone Encounter (Signed)
[facility] Per Dr. Drue SecondSnider results not back yet; patient notified. Wendall MolaJacqueline Denice Cardon

## 2014-03-18 ENCOUNTER — Other Ambulatory Visit: Payer: Self-pay

## 2014-03-18 ENCOUNTER — Telehealth: Payer: Self-pay | Admitting: *Deleted

## 2014-03-18 DIAGNOSIS — Z1231 Encounter for screening mammogram for malignant neoplasm of breast: Secondary | ICD-10-CM

## 2014-03-18 NOTE — Telephone Encounter (Signed)
Patient left message asking if the test results were back yet.  Also stated that she would like to go ahead with the [place], stating that it would take her months to be seen there and she can always cancel the appointment. Andree CossMichelle M Carollyn Etcheverry, RN

## 2014-03-18 NOTE — Telephone Encounter (Signed)
Per Dr Drue SecondSnider [facility] to schedule the patient an appt. Was given 04/19/14 @ 8 am as her date and time. Also given [phone] as the fax number to send notes prior to the patient visit.   Called the patient and gave her the information for the appt and advised her if it is to early due to distance she can call and reschedule but she advised the time is fine and she will keep the appt. Also advised her to park in the deck near the [facility] [address] and go to the 4th floor. She advised she understands and will call with any questions.

## 2014-03-22 NOTE — Progress Notes (Signed)
Subjective:    Patient ID: Caitlyn BaconNancy J Yates, female    DOB: [birth year], 63 y.o.   MRN: [id number]  HPI Caitlyn Yates is a 63yo F with history of HLD and lived in [place] in [place] for 1 year. Did a 2 week trip to [place] in Oct 2013 and upon return from her trip, she thought she started to have the beginning of short term memory loss. She also had a week long trip to [place] in Sep 2014, recalled eating undercook/rancid meat one time. Since December 2014, she has had significant fatigue, headache. She was referred to neurology with Dr. Anne HahnWillis where she underwent brian/spine MRI for concern of MS. CSF spinal fluid analysis non-revealing. In Jan 2015 ,she sought care with [facility]. 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 [place] which includes taking combination of minocycline, doxycycline, ceftriaxone, flagyl and fluconazole in rotating 3 wk schedule for the past 3 months. She states that she has not had much improvement with fatigue, and headache. She was referred her by her PCP, Dr. 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 Since stopping detox regimen, she feels that headaches and fatigue still persists. She is here for lab results  Current Outpatient Prescriptions on File Prior to Visit  Medication Sig Dispense Refill  . ALPRAZolam (XANAX) 0.5 MG tablet Take 1 tablet by mouth daily.      Marland Kitchen. BIOTIN PO Take by mouth. 8mg       . Cholecalciferol (VITAMIN D-3 PO) Take 1 tablet by mouth daily.      . Digestive Enzymes (BETAINE HCL PO) Take 1 tablet by mouth daily.      . Multiple Vitamins-Minerals (MEMORY VITE PO) Take 1 tablet by mouth daily.      . Nutritional Supplements (OSTEO ADVANCE PO) Take 2 tablets by mouth daily.      . Omega-3 Fatty Acids (OMEGA 3 PO) Take by mouth.      .  simvastatin (ZOCOR) 10 MG tablet Take 0.5 tablets by mouth daily.       No current facility-administered medications on file prior to visit.   Active Ambulatory Problems    Diagnosis Date Noted  . Travel foreign 07/13/2013  . White matter abnormality on MRI of brain 12/13/2013  . Other malaise and fatigue 12/13/2013   Resolved Ambulatory Problems    Diagnosis Date Noted  . No Resolved Ambulatory Problems   Past Medical History  Diagnosis Date  . High cholesterol   . Anxiety      Review of Systems On going intermittent headaches and fatigue. 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 From infectious diseases standpoint, I don't believe that this test can definitively diagnose active disease of a specific parasitic infection.  She may have had exposure to pathogen that causes neurocystercercosis. We are awaiting results from [facility]. I think there is a small chance that the serology will be positive to explain her constellation of  symptoms. i recommended that she pursue going back to neurology to follow up on repeat mri and possible LP to see if her Winter 2014 MRI could be due to early MS or MS like disease process.  Will refer for 2nd opinion at [facility].

## 2014-04-05 ENCOUNTER — Ambulatory Visit: Payer: BC Managed Care – PPO

## 2014-05-27 ENCOUNTER — Ambulatory Visit
Admission: RE | Admit: 2014-05-27 | Discharge: 2014-05-27 | Disposition: A | Discharge status: Home / Self Care | Payer: BC Managed Care – PPO | Source: Ambulatory Visit

## 2014-05-27 DIAGNOSIS — Z1231 Encounter for screening mammogram for malignant neoplasm of breast: Secondary | ICD-10-CM

## 2014-07-26 NOTE — Telephone Encounter (Signed)
Noted  

## 2015-05-26 ENCOUNTER — Other Ambulatory Visit: Payer: Self-pay
